# Patient Record
Sex: Male | Born: 1935 | Race: Black or African American | Hispanic: No | Marital: Married | Smoking: Former smoker
Health system: Southern US, Community
[De-identification: ages and names within clinical notes are randomized; demographics above are authoritative.]

## PROBLEM LIST (undated history)

## (undated) DIAGNOSIS — J449 Chronic obstructive pulmonary disease, unspecified: Secondary | ICD-10-CM

## (undated) DIAGNOSIS — N189 Chronic kidney disease, unspecified: Secondary | ICD-10-CM

## (undated) DIAGNOSIS — K449 Diaphragmatic hernia without obstruction or gangrene: Secondary | ICD-10-CM

## (undated) DIAGNOSIS — M1990 Unspecified osteoarthritis, unspecified site: Secondary | ICD-10-CM

## (undated) DIAGNOSIS — I5189 Other ill-defined heart diseases: Secondary | ICD-10-CM

## (undated) DIAGNOSIS — I471 Supraventricular tachycardia, unspecified: Secondary | ICD-10-CM

## (undated) DIAGNOSIS — I2510 Atherosclerotic heart disease of native coronary artery without angina pectoris: Secondary | ICD-10-CM

## (undated) DIAGNOSIS — N400 Enlarged prostate without lower urinary tract symptoms: Secondary | ICD-10-CM

## (undated) DIAGNOSIS — D649 Anemia, unspecified: Secondary | ICD-10-CM

## (undated) HISTORY — DX: Supraventricular tachycardia, unspecified: I47.10

## (undated) HISTORY — PX: OTHER SURGICAL HISTORY: SHX169

## (undated) HISTORY — DX: Chronic obstructive pulmonary disease, unspecified: J44.9

## (undated) HISTORY — DX: Other ill-defined heart diseases: I51.89

## (undated) HISTORY — DX: Supraventricular tachycardia: I47.1

## (undated) HISTORY — DX: Unspecified osteoarthritis, unspecified site: M19.90

## (undated) HISTORY — DX: Atherosclerotic heart disease of native coronary artery without angina pectoris: I25.10

## (undated) HISTORY — DX: Benign prostatic hyperplasia without lower urinary tract symptoms: N40.0

## (undated) HISTORY — DX: Diaphragmatic hernia without obstruction or gangrene: K44.9

## (undated) HISTORY — DX: Anemia, unspecified: D64.9

## (undated) HISTORY — DX: Chronic kidney disease, unspecified: N18.9

---

## 2004-10-01 ENCOUNTER — Ambulatory Visit: Payer: Self-pay | Admitting: Cardiology

## 2004-10-29 ENCOUNTER — Ambulatory Visit: Payer: Self-pay | Admitting: Cardiology

## 2004-11-09 ENCOUNTER — Ambulatory Visit: Payer: Self-pay | Admitting: Cardiovascular Disease

## 2004-11-09 ENCOUNTER — Inpatient Hospital Stay (HOSPITAL_BASED_OUTPATIENT_CLINIC_OR_DEPARTMENT_OTHER): Admission: RE | Admit: 2004-11-09 | Discharge: 2004-11-09 | Payer: Self-pay | Admitting: Cardiology

## 2004-11-16 ENCOUNTER — Ambulatory Visit: Payer: Self-pay | Admitting: Internal Medicine

## 2004-11-19 ENCOUNTER — Ambulatory Visit: Payer: Self-pay | Admitting: Cardiology

## 2008-03-14 ENCOUNTER — Encounter (INDEPENDENT_AMBULATORY_CARE_PROVIDER_SITE_OTHER): Payer: Self-pay | Admitting: Orthopaedic Surgery

## 2008-03-14 ENCOUNTER — Inpatient Hospital Stay (HOSPITAL_COMMUNITY): Admission: RE | Admit: 2008-03-14 | Discharge: 2008-03-17 | Payer: Self-pay | Admitting: Orthopaedic Surgery

## 2008-03-17 ENCOUNTER — Encounter (INDEPENDENT_AMBULATORY_CARE_PROVIDER_SITE_OTHER): Payer: Self-pay | Admitting: Orthopaedic Surgery

## 2008-03-17 ENCOUNTER — Ambulatory Visit: Payer: Self-pay | Admitting: *Deleted

## 2009-06-06 ENCOUNTER — Ambulatory Visit: Payer: Self-pay | Admitting: Cardiology

## 2009-07-10 IMAGING — CR DG CHEST 2V
3 series · 3 of 3 positions shown · non-contrast
Comparison: CT of the chest on 11/16/2004

CLINICAL DATA: Osteoarthritis of the right knee.  Preoperative
assessment.

CHEST - 2 VIEW

[view not recorded (1 of 3)]
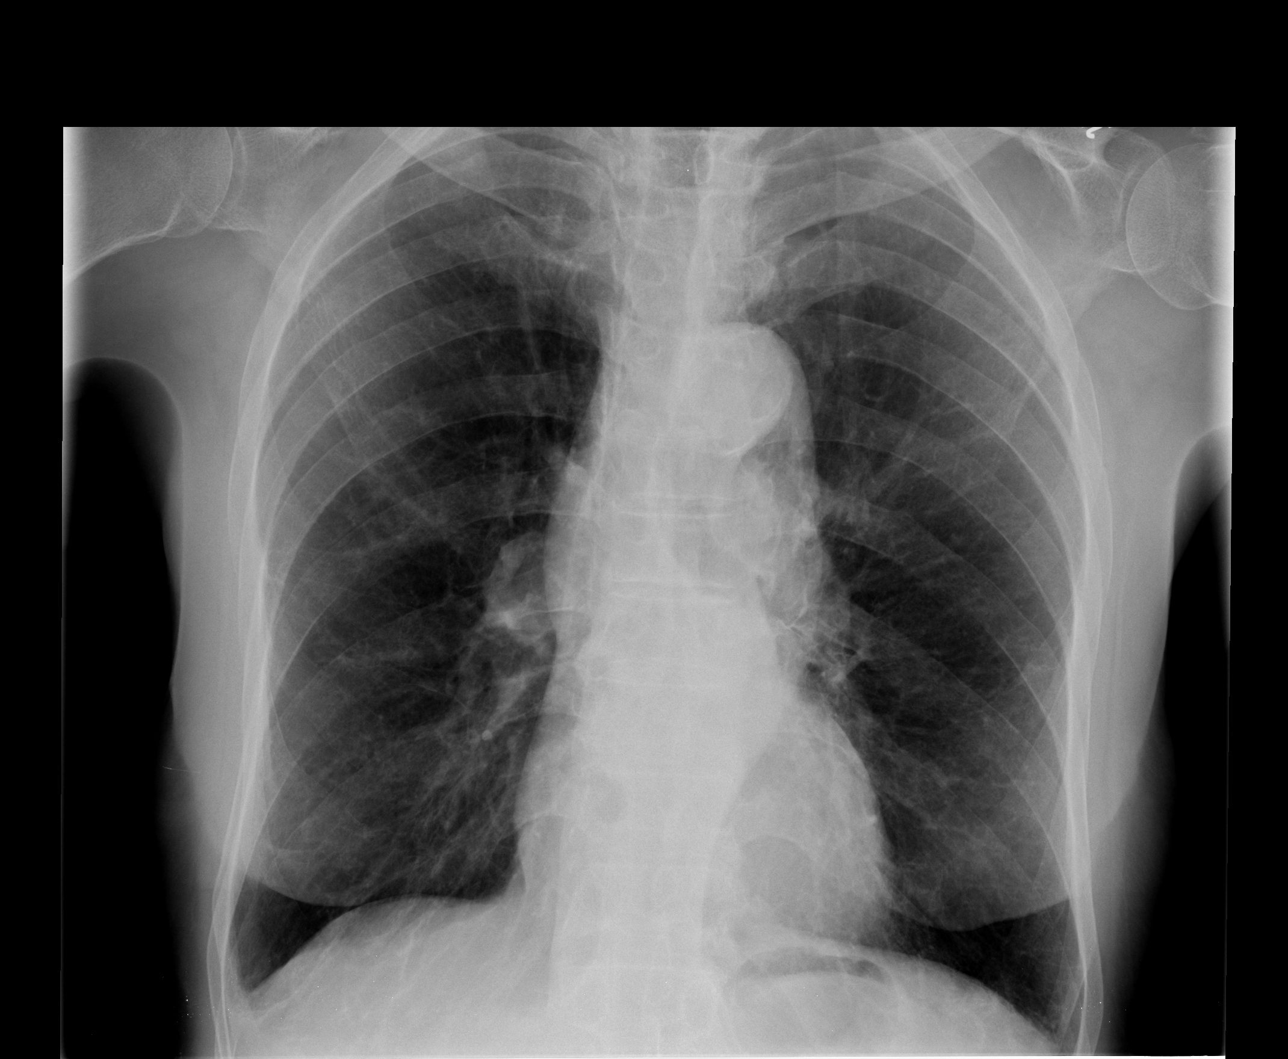

[view not recorded (2 of 3)]
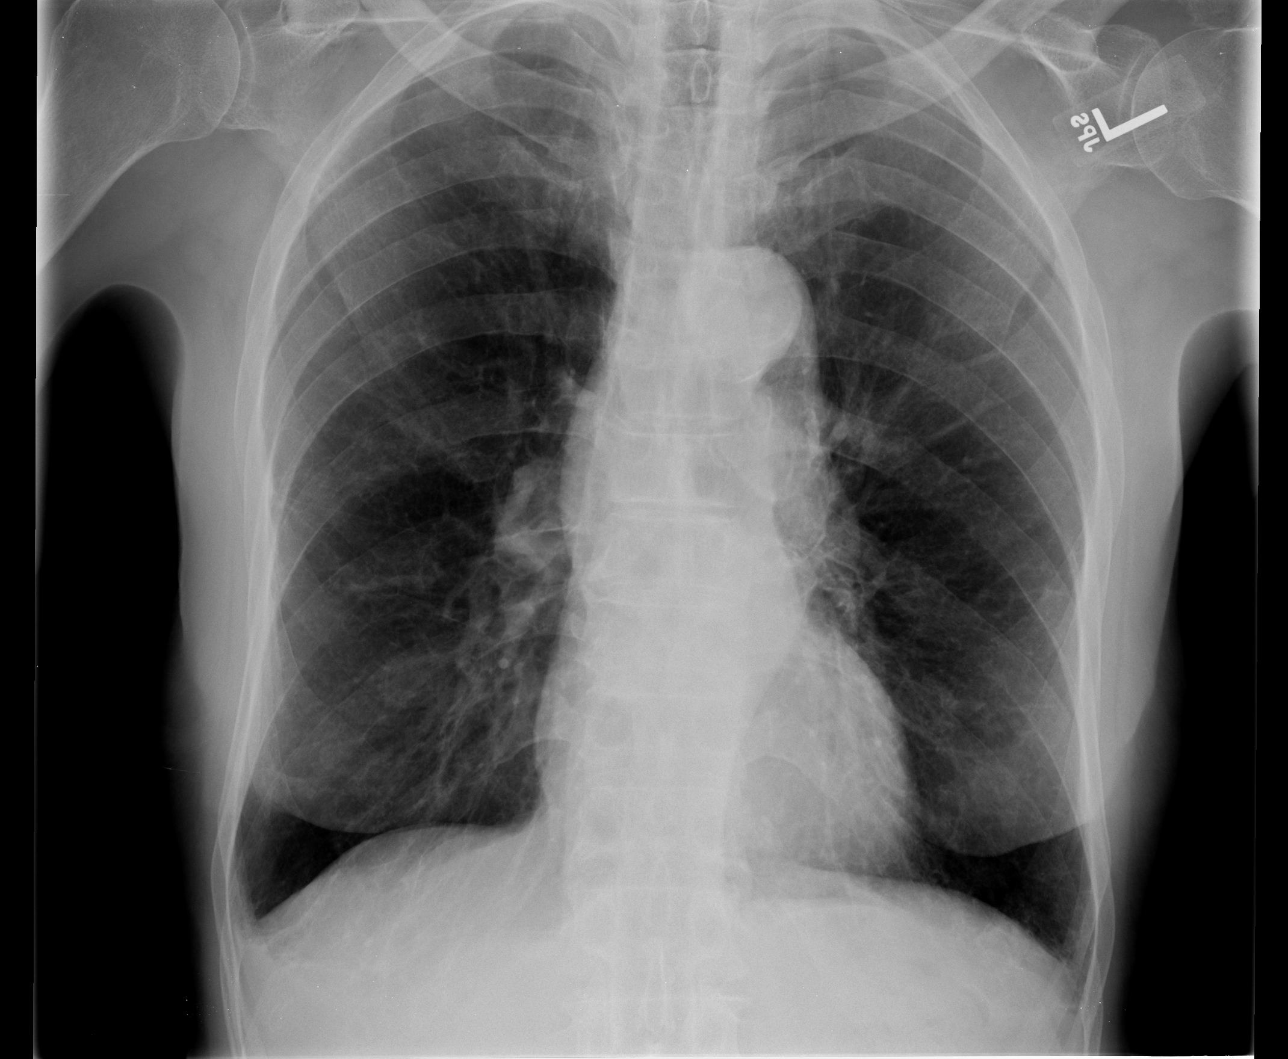

[view not recorded (3 of 3)]
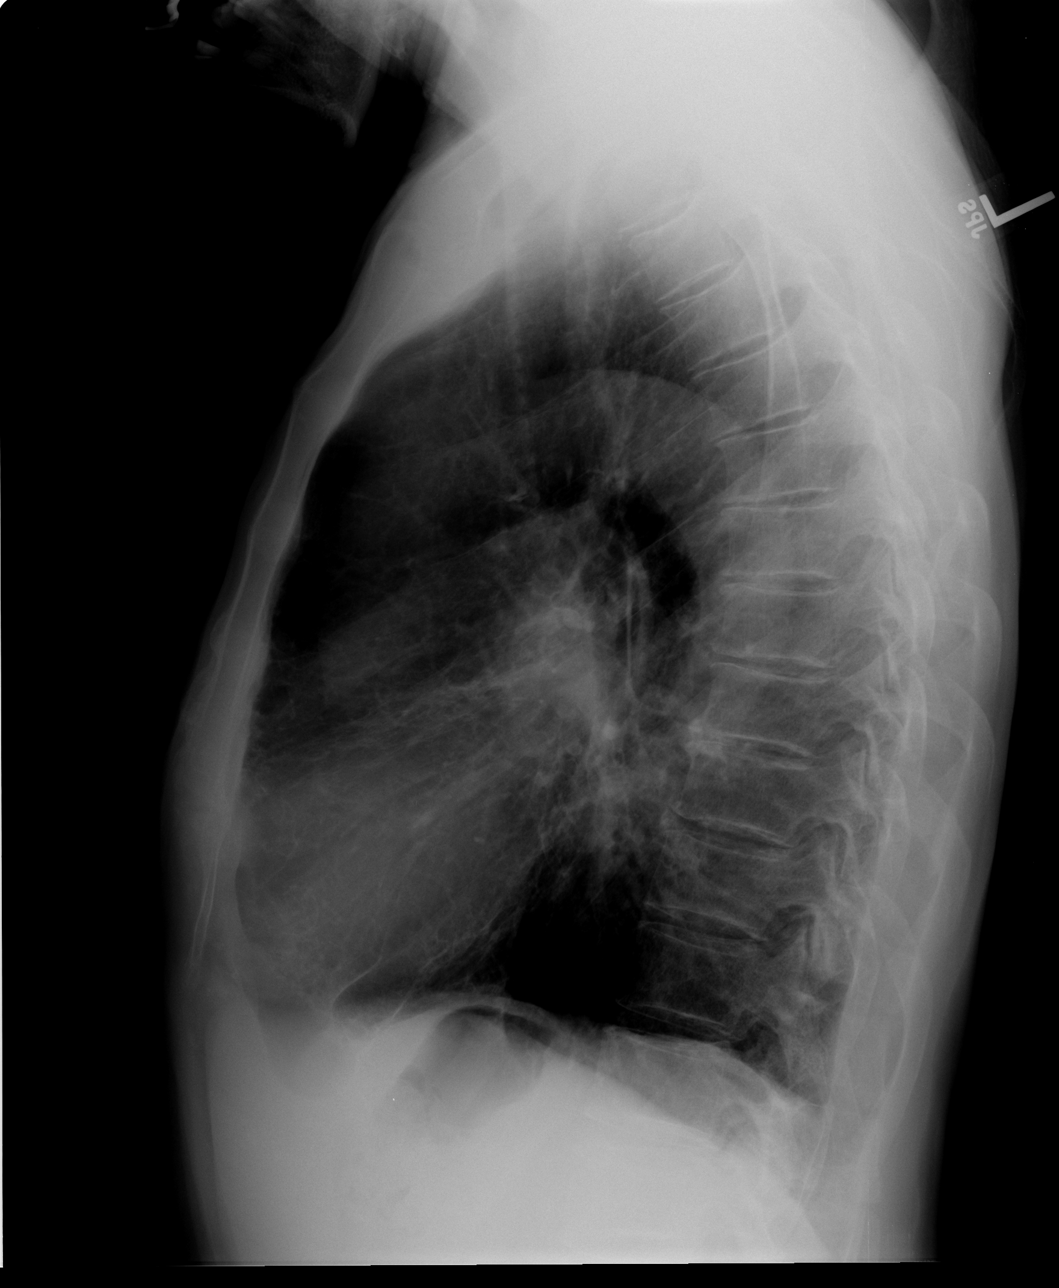

[3 of 3 positions shown; findings below may reference images not displayed]

FINDINGS: Changes of COPD are evident with hyperexpansion,
flattening of the hemidiaphragms and an increase in the
retrosternal air space.  Taking this into consideration.  Heart
size is within normal limits.  Mild ectasia of the thoracic aorta
is identified and is stable in comparison with the prior exam.
This finding can be seen with hypertension.  Prominence of
pulmonary arterial segments are seen and suggests the possibility
of some degree of pulmonary arterial hypertension.  This finding is
also stable.  The lung fields appear clear no evidence for focal
infiltrate or congestive failure.  Structures demonstrate some mild
degenerative change in the mid thoracic spine with anterior
osteophytosis noted.
IMPRESSION: Stable COPD changes with no acute disease noted.

## 2009-11-19 ENCOUNTER — Encounter: Payer: Self-pay | Admitting: Cardiology

## 2009-11-25 ENCOUNTER — Encounter (INDEPENDENT_AMBULATORY_CARE_PROVIDER_SITE_OTHER): Payer: Self-pay | Admitting: Internal Medicine

## 2009-11-25 ENCOUNTER — Inpatient Hospital Stay (HOSPITAL_COMMUNITY): Admission: RE | Admit: 2009-11-25 | Discharge: 2009-11-28 | Payer: Self-pay | Admitting: Orthopaedic Surgery

## 2010-07-03 DIAGNOSIS — R7989 Other specified abnormal findings of blood chemistry: Secondary | ICD-10-CM

## 2010-07-03 DIAGNOSIS — I471 Supraventricular tachycardia: Secondary | ICD-10-CM

## 2010-07-03 DIAGNOSIS — R0602 Shortness of breath: Secondary | ICD-10-CM

## 2010-07-06 DIAGNOSIS — R000 Tachycardia, unspecified: Secondary | ICD-10-CM

## 2010-07-11 ENCOUNTER — Encounter: Payer: Self-pay | Admitting: Cardiology

## 2010-07-16 ENCOUNTER — Encounter: Payer: Self-pay | Admitting: *Deleted

## 2010-07-18 LAB — URINALYSIS, ROUTINE W REFLEX MICROSCOPIC
Glucose, UA: NEGATIVE mg/dL
pH: 6.5 (ref 5.0–8.0)

## 2010-07-18 LAB — CBC
HCT: 25.6 % — ABNORMAL LOW (ref 39.0–52.0)
HCT: 29 % — ABNORMAL LOW (ref 39.0–52.0)
MCH: 30.3 pg (ref 26.0–34.0)
MCH: 30.7 pg (ref 26.0–34.0)
MCH: 30.8 pg (ref 26.0–34.0)
MCHC: 33.2 g/dL (ref 30.0–36.0)
MCHC: 33.7 g/dL (ref 30.0–36.0)
MCV: 92.1 fL (ref 78.0–100.0)
MCV: 92.9 fL (ref 78.0–100.0)
Platelets: 145 10*3/uL — ABNORMAL LOW (ref 150–400)
Platelets: 219 10*3/uL (ref 150–400)
RBC: 2.36 MIL/uL — ABNORMAL LOW (ref 4.22–5.81)
RBC: 3.68 MIL/uL — ABNORMAL LOW (ref 4.22–5.81)
RDW: 13.9 % (ref 11.5–15.5)
RDW: 14.1 % (ref 11.5–15.5)
RDW: 14.4 % (ref 11.5–15.5)
WBC: 12 10*3/uL — ABNORMAL HIGH (ref 4.0–10.5)
WBC: 15.7 10*3/uL — ABNORMAL HIGH (ref 4.0–10.5)
WBC: 17.6 10*3/uL — ABNORMAL HIGH (ref 4.0–10.5)

## 2010-07-18 LAB — URINE CULTURE
Colony Count: NO GROWTH
Special Requests: POSITIVE

## 2010-07-18 LAB — TYPE AND SCREEN

## 2010-07-18 LAB — BASIC METABOLIC PANEL
BUN: 17 mg/dL (ref 6–23)
BUN: 19 mg/dL (ref 6–23)
BUN: 20 mg/dL (ref 6–23)
CO2: 24 mEq/L (ref 19–32)
Calcium: 8 mg/dL — ABNORMAL LOW (ref 8.4–10.5)
Calcium: 8.4 mg/dL (ref 8.4–10.5)
Chloride: 107 mEq/L (ref 96–112)
Creatinine, Ser: 1.75 mg/dL — ABNORMAL HIGH (ref 0.4–1.5)
Creatinine, Ser: 1.89 mg/dL — ABNORMAL HIGH (ref 0.4–1.5)
GFR calc Af Amer: 41 mL/min — ABNORMAL LOW (ref >60)
GFR calc non Af Amer: 33 mL/min — ABNORMAL LOW (ref >60)
Glucose, Bld: 98 mg/dL (ref 70–99)
Potassium: 3.9 mEq/L (ref 3.5–5.1)
Sodium: 134 mEq/L — ABNORMAL LOW (ref 135–145)

## 2010-07-18 LAB — URINALYSIS, MICROSCOPIC ONLY
Bilirubin Urine: NEGATIVE
Glucose, UA: NEGATIVE mg/dL
Ketones, ur: NEGATIVE mg/dL
pH: 5 (ref 5.0–8.0)

## 2010-07-18 LAB — COMPREHENSIVE METABOLIC PANEL
AST: 20 U/L (ref 0–37)
Albumin: 3.6 g/dL (ref 3.5–5.2)
BUN: 17 mg/dL (ref 6–23)
Calcium: 8.3 mg/dL — ABNORMAL LOW (ref 8.4–10.5)
Chloride: 107 mEq/L (ref 96–112)
Creatinine, Ser: 1.61 mg/dL — ABNORMAL HIGH (ref 0.4–1.5)
Creatinine, Ser: 2.02 mg/dL — ABNORMAL HIGH (ref 0.4–1.5)
GFR calc Af Amer: 38 mL/min — ABNORMAL LOW (ref >60)
GFR calc non Af Amer: 41 mL/min — ABNORMAL LOW (ref >60)
Glucose, Bld: 103 mg/dL — ABNORMAL HIGH (ref 70–99)
Potassium: 4.7 mEq/L (ref 3.5–5.1)
Sodium: 135 mEq/L (ref 135–145)
Sodium: 138 mEq/L (ref 135–145)
Total Bilirubin: 0.5 mg/dL (ref 0.3–1.2)
Total Protein: 6.5 g/dL (ref 6.0–8.3)

## 2010-07-18 LAB — SODIUM, URINE, TIMED: Sodium, 24H Ur: 180 mEq/d (ref 40–220)

## 2010-07-18 LAB — DIFFERENTIAL
Basophils Absolute: 0.1 10*3/uL (ref 0.0–0.1)
Eosinophils Relative: 5 % (ref 0–5)
Lymphocytes Relative: 17 % (ref 12–46)
Lymphocytes Relative: 7 % — ABNORMAL LOW (ref 12–46)
Lymphs Abs: 1.1 10*3/uL (ref 0.7–4.0)
Lymphs Abs: 2 10*3/uL (ref 0.7–4.0)
Monocytes Absolute: 1 10*3/uL (ref 0.1–1.0)
Monocytes Relative: 9 % (ref 3–12)
Neutro Abs: 11.9 10*3/uL — ABNORMAL HIGH (ref 1.7–7.7)
Neutrophils Relative %: 83 % — ABNORMAL HIGH (ref 43–77)

## 2010-07-18 LAB — URINE MICROSCOPIC-ADD ON

## 2010-07-18 LAB — PREPARE RBC (CROSSMATCH)

## 2010-07-18 LAB — SURGICAL PCR SCREEN: Staphylococcus aureus: POSITIVE — AB

## 2010-07-22 ENCOUNTER — Encounter: Payer: Self-pay | Admitting: Cardiology

## 2010-07-22 ENCOUNTER — Ambulatory Visit (INDEPENDENT_AMBULATORY_CARE_PROVIDER_SITE_OTHER): Payer: Medicare Other | Admitting: Cardiology

## 2010-07-22 VITALS — BP 108/69 | HR 80 | Ht 68.0 in | Wt 155.0 lb

## 2010-07-22 DIAGNOSIS — Z79899 Other long term (current) drug therapy: Secondary | ICD-10-CM

## 2010-07-22 DIAGNOSIS — I2510 Atherosclerotic heart disease of native coronary artery without angina pectoris: Secondary | ICD-10-CM | POA: Insufficient documentation

## 2010-07-22 DIAGNOSIS — J449 Chronic obstructive pulmonary disease, unspecified: Secondary | ICD-10-CM

## 2010-07-22 DIAGNOSIS — I498 Other specified cardiac arrhythmias: Secondary | ICD-10-CM

## 2010-07-22 DIAGNOSIS — I4892 Unspecified atrial flutter: Secondary | ICD-10-CM

## 2010-07-22 DIAGNOSIS — I471 Supraventricular tachycardia, unspecified: Secondary | ICD-10-CM | POA: Insufficient documentation

## 2010-07-22 NOTE — Assessment & Plan Note (Signed)
Will check followup metabolic profile, as well as BNP level.

## 2010-07-22 NOTE — Assessment & Plan Note (Signed)
Continue current medical therapy with combination beta blocker and calcium channel blocker. No further workup currently indicated.

## 2010-07-22 NOTE — Progress Notes (Signed)
HPI: Patient presents to our office for the first time, following recent referral to [place] here at [facility], for evaluation of dyspnea and newly documented SVT. He presented with no prior history of heart disease.  We felt his rhythm to be probable atrial flutter, which successfully reverted back to normal sinus rhythm, on medical therapy. A 2-D echo was obtained, indicating normal LVF (EF 60-65%), with grade 3 diastolic dysfunction, and no significant valvular abnormalities. Serial cardiac markers were drawn, notable for nondiagnostic troponins. Patient had had prior catheterization in 2006 revealing nonobstructive CAD. More recently, he had a Lexis scan Cardiolite, 2/11, which was negative for ischemia; EF 48%.  We adjusted his medications with discontinuation of digoxin, in light of chronic kidney disease. He was discharged on a combination of beta blocker and calcium channel blocker, for rate and rhythm control.  Since his hospitalization, the patient has done well. He denies any recurrent dyspnea. He continues to report no tachycardia palpitations. He also denies any exertional chest pain.  No Known Allergies  Current Outpatient Prescriptions on File Prior to Visit  Medication Sig Dispense Refill  . albuterol (PROVENTIL) (2.5 MG/3ML) 0.083% nebulizer solution Take 2.5 mg by nebulization every 6 (six) hours as needed.        Marland Kitchen allopurinol (ZYLOPRIM) 300 MG tablet Take 300 mg by mouth daily.        Marland Kitchen aspirin 81 MG tablet Take 81 mg by mouth daily.        Marland Kitchen docusate sodium (COLACE) 100 MG capsule Take 2 by mouth twice daily        . Fluticasone-Salmeterol (ADVAIR DISKUS) 250-50 MCG/DOSE AEPB Inhale 1 puff into the lungs every 12 (twelve) hours.        . isosorbide mononitrate (IMDUR) 60 MG 24 hr tablet Take 1/2 by mouth daily       . megestrol (MEGACE) 40 MG/ML suspension Take 5ml by mouth twice times daily      . metoprolol tartrate (LOPRESSOR) 25 MG tablet Take one by mouth twice daily        . polyethylene glycol (MIRALAX / GLYCOLAX) packet 17 gm mixed with 8 oz water or juice twice daily       . DISCONTD: diltiazem (CARDIZEM CD) 360 MG 24 hr capsule Take 360 mg by mouth daily.          Past Medical History  Diagnosis Date  . COPD (chronic obstructive pulmonary disease)   . Coronary artery disease   . Supraventricular tachycardia   . Chronic kidney disease     stage 3  . Gout   . Hiatal hernia   . BPH (benign prostatic hypertrophy)   . DJD (degenerative joint disease)     severe of both knees    Past Surgical History  Procedure Date  . Bilateral knee replacements   . Bilateral cataracts     History   Social History  . Marital Status: Married    Spouse Name: N/A    Number of Children: N/A  . Years of Education: N/A   Occupational History  . Not on file.   Social History Main Topics  . Smoking status: Former Smoker -- 2.0 packs/day for 25 years    Quit date: 05/03/1960  . Smokeless tobacco: Not on file  . Alcohol Use: No  . Drug Use: Not on file  . Sexually Active: Not on file   Other Topics Concern  . Not on file   Social History Narrative  .  No narrative on file    Family History  Problem Relation Age of Onset  . Stomach cancer Father     ROS: The patient denies fatigue, malaise, fever, weight gain/loss, vision loss, decreased hearing, hoarseness, chest pain, palpitations, shortness of breath, prolonged cough, wheezing, sleep apnea, coughing up blood, abdominal pain, blood in stool, nausea, vomiting, diarrhea, heartburn, incontinence, blood in urine, muscle weakness, joint pain, leg swelling, rash, skin lesions, headache, fainting, dizziness, depression, anxiety, enlarged lymph nodes, easy bruising or bleeding, and environmental allergies.      PHYSICAL EXAM:  BP 108/69  Pulse 80  Ht 5\' 8"  (1.727 m)  Wt 155 lb (70.308 kg)  BMI 23.57 kg/m2  General: Well-developed, well-nourished in no distress head: Normocephalic and  atraumatic eyes PERRLA/EOMI intact, conjunctiva and lids normal nose: No deformity or lesions mouth normal dentition, normal posterior pharynx neck: Supple, JVP noted on the right, at 90 degrees.  No masses, thyromegaly or abnormal cervical nodes lungs: Normal breath sounds bilaterally without wheezing.  Normal percussion heart: regular rate and rhythm with normal S1 and S2, no S3 or S4.  PMI is normal.  No pathological murmurs abdomen: Normal bowel sounds, abdomen is soft and nontender without masses, organomegaly or hernias noted.  No hepatosplenomegaly musculoskeletal: Back normal, normal gait muscle strength and tone normal pulsus: Pulse is normal in all 4 extremities Extremities: 2+ right ankle edema, trace on the left neurologic: Alert and oriented x 3 skin: Intact without lesions or rashes cervical nodes: No significant adenopathy psychologic: Normal affect  ASSESSMENT & PLAN:

## 2010-07-22 NOTE — Patient Instructions (Signed)
Your physician recommends that you go to the [facility] for lab work: BMET/BNP. Your physician recommends that you continue on your current medications as directed. Please refer to the Current Medication list given to you today.

## 2010-07-22 NOTE — Assessment & Plan Note (Signed)
Quiescent on current medical therapy. No further workup indicated.

## 2010-07-28 ENCOUNTER — Encounter: Payer: Self-pay | Admitting: Physician Assistant

## 2010-07-30 NOTE — Letter (Signed)
Summary: Discharge Summary  Discharge Summary   Imported By: Zachary George 07/22/2010 14:31:16  _____________________________________________________________________  External Attachment:    Type:   Image     Comment:   External Document

## 2010-09-15 NOTE — Op Note (Signed)
NAME:  ESTER, MABE NO.:  [id number]   MEDICAL RECORD NO.:  [id number]          PATIENT TYPE:  INP   LOCATION:  5033                         FACILITY:  [facility]   PHYSICIAN:  Claude Manges. Whitfield, M.D.DATE OF BIRTH:  11-18-1924   DATE OF PROCEDURE:  03/14/2008  DATE OF DISCHARGE:                               OPERATIVE REPORT   PREOPERATIVE DIAGNOSIS:  End-stage osteoarthritis, right knee.   POSTOPERATIVE DIAGNOSIS:  End-stage osteoarthritis, right knee.   PROCEDURE:  Right total knee replacement.   SURGEON:  Claude Manges. Cleophas Dunker, MD   ASSISTANT:  Oris Drone. Petrarca, PA-C   ANESTHESIA:  General with supplemental femoral nerve block.   COMPLICATIONS:  None.   COMPONENTS:  DePuy LCS large femoral component, a #5 rotating keeled  tibial tray, a 10-mm polyethylene bridging bearing, a tried pegged metal  back rotating patella corresponding the size of the femur.  All  components were secured with polymethyl methacrylate.   PROCEDURE:  With comfortable, the patient was met in the holding area,  all questions were answered.  The right lower extremity was marked as  the appropriate extremity.  The patient was taken to room #4 where we  was placed under general orotracheal anesthesia.  He did have a  preoperative femoral nerve block.  Nursing staff inserted a Foley  catheter.  Urine was clear.   The right lower extremity was then placed in a right thigh tourniquet.  The leg was then prepped with Betadine, scrub and DuraPrep from the  tourniquet to the midfoot.  Sterile draping was performed with the  extremity still elevated, was Esmarch exsanguinated with a proximal  tourniquet at 350 mmHg.   A time-out was called.   A midline longitudinal incision was then made beginning at the superior  pouch extending the tibial tubercle via sharp dissection.  Incision was  carried down to the subcutaneous tissue.  First layer of capsule was  incised in the midline.  A medial  parapatellar incision was made with  the Bovie.  The joint was then entered.  There was a minimal clear  yellow joint effusion.   The patella was everted to 180 degrees and knee flexed to 90 degrees.   The joint was then explored.  There was diffuse synovitis, some of which  was rust-colored.  We sent specimens for final sectioning, a synovectomy  was performed.  There are large osteophytes along the medial and lateral  femoral condyle and the medial tibial plateau.  These were removed.  There was complete absence of articular cartilage in the lateral  compartment with the eburnated bone and nearly the same on the lateral  compartment.  There was erosion of the bone in the posterior lateral  tibial plateau, but had good firm bone.   We templated a large femoral component and this was confirmed  intraoperatively.   The initial cut was then made in the tibia with a 7-degree of posterior  declination.  Subsequent cuts were then made on the femur with a 3-  degree distal femoral valgus cut.  MCL and LCL remained intact  throughout  the operative procedure.  A lamina spreaders were then  inserted along the medial and lateral compartments and remnants of ACL  and PCL, removed as well as the medial and lateral menisci.  Osteophytes  on the posterior femoral condyles were removed with a curved osteotome.   The final femoral cut was then made to provide tapered cuts.   Retractor was then placed about the tibia.  We measured a #5 tibial  tray.  A template was applied.  The center hole made followed by the  keeled cut.  There was an area where he have depression at the tibial  plateau from where posterior laterally and we drilled this with multiple  pinholes.   With the trial tibial tray in place, the 10-mm bridging bearing was  inserted followed by the large femoral component through a full range of  motion.  We had full extension and we had flexion well over 120 degrees  without any  malrotation of the tibial tray.  There was no opening with  varus or valgus stress.   Patella was then prepared by removing 12-mm of bone leaving of patella  thickness of 14.  Template was applied.  Three holes drilled in the  patella and the trial patella was applied through a full range of  motion.  There was no subluxation.   The trial components were then removed.  The joint was copiously  irrigated with the jet saline.  With the retractors in placed, we  impacted the tibial tray with polymethyl methacrylate and finger stuff  the tibial hole.  We applied glue in the poster lateral tibial plateau  to buttress the gap.  The final 10-mm bridging bearing was then applied.  Extraneous methacrylate was removed from around the tibia.   The large femoral component was impacted with polymethyl methacrylate,  but the components were then placed in extension and impacted, then a  further extraneous methacrylate was removed with a Glorious Peach.   The patella was applied with methacrylate and a patellar clamp.   After complete maturation of the methacrylate, the joint was then  inspected.  There were few areas of extraneous methacrylate, was removed  with an osteotome.  Joint was then copiously irrigated with saline  again.  Marcaine with epinephrine was injected in the deep capsule.  Tourniquet was deflated.  We had immediate capillary refill to the  operative site.  Gross bleeders were Bovie coagulated.  The Hemovac was  not necessary.   The deep capsule was closed with interrupted #1 Ethibond.  Superficial  capsule closed with a running 0 Vicryl, subcu with 2-0 Vicryl, skin  closed with skin clips.  Sterile bulky dressing was applied followed by  the patient's compressive stocking.   The patient tolerated the procedure well without complications and  returned to the postanesthesia recovery room in satisfactory condition.      Claude Manges. Cleophas Dunker, M.D.  Electronically Signed      PWW/MEDQ  D:  03/14/2008  T:  03/15/2008  Job:  [id number]

## 2010-09-18 NOTE — Discharge Summary (Signed)
NAME:  Ian Robles NO.:  [id number]   MEDICAL RECORD NO.:  [id number]          PATIENT TYPE:  INP   LOCATION:  5033                         FACILITY:  [facility]   PHYSICIAN:  Claude Manges. Whitfield, M.D.DATE OF BIRTH:  09-15-1924   DATE OF ADMISSION:  03/14/2008  DATE OF DISCHARGE:  03/17/2008                               DISCHARGE SUMMARY   ADMISSION DIAGNOSIS:  Osteoarthritis of the right knee.   DISCHARGE DIAGNOSES:  1. Osteoarthritis and rheumatoid arthritis of the right knee.  2. History of renal calculi.  3. History of tinnitus.  4. Posthemorrhagic anemia.  5. Hypertension.  6. History of tobacco use.   PROCEDURE:  Right total knee arthroplasty.   HISTORY:  Ian Robles is a very pleasant 77 year old male with  chronic right knee pain, which is worsened over the last several months.  There is no history of injury or trauma.  He has been diagnosed with  rheumatoid arthritis in the past.  Now having severe sharp and throbbing  pain, which has worsen.  Radiographic end-stage osteoarthritis right  knee.  Indicated for right total knee arthroplasty.   HOSPITAL COURSE:  An 77 year old male admitted on March 14, 2008, and  after appropriate laboratory studies were obtained as well as 1 g of  Ancef IV, on-call via operating room.  He was taken to the operating  room, where he underwent a right total knee arthroplasty.  He tolerated  the procedure well.  He was continued on Ancef 1 g IV q. 6-8 h. x3  doses.  He was started on Coumadin protocol per Pharmacy.  Total knee  protocol for Lovenox 30 mg subcu q.12 h. started on April 01, 2008,  at 8 a.m.  Foley was placed intraoperatively.  CPM 0-6 degrees for 6-8  hours per day, increasing by 5-10 degrees a day.  Consulted PT, OT, and  care management.  Partial weightbearing 50% of body weight.  Dilaudid  reduced dose PCA was given for pain medications for postop pain.  The  PCA was discontinued on the 13th.   The IV was then saline locked after  DC.  He was weaned off his O2, keeping the sats greater than 90%.  Cepacaine lozenges were used for his sore throat that he developed in  the hospital.  He was transfused on the 14th.  He was transfused 2 units  of packed cells with 20 of Lasix between units.  Urine and her routine  C&S was ordered on November 14.  Dressing change to the right knee was  also ordered.  Apparently, he developed shortness of breath, and his  blood transfusion restarted by Dr. Eulah Pont, and a portable chest x-ray  was ordered.  20 units of Lasix was given stat at 2130 hours on the  14th.  The second unit of blood was not given.  There was an order for  additional 20 mg of Lasix IV after the first unit of blood if short of  breath.  On the 15th, he had venous Dopplers ordered to rule out DVT.  Cipro 250 b.i.d. was started.  He  was discharged on the 15th to home.  Dopplers revealed no evidence of DVT.  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  Septal infarct age indeterminate noted.  Preop  hemoglobin 11.8, hematocrit 35.9%, white count 15,200, and platelets  382,000.  He dropped to 7.9 hemoglobin on the November 14.  He received  20 units of blood and his hemoglobin at the time of discharge was 8.9,  hematocrit 26.5%, white count of 15,000, platelets 218,000.  Protime  preop 14.3, INR 1.1, PTT was 32.  At discharge, PT was 20.0, INR 1.6.  Preop sodium 139, potassium 4.2, chloride 105, CO2 of 28, glucose 92,  BUN 15, creatinine 0.94.  Discharge sodium 137, potassium 3.44, chloride  101, CO2 of 28, glucose 97, BUN 13, creatinine 0.87.  GFR preop was  greater than 60 and discharge is greater than 60.  Preop total protein  7.6, albumin 3.0, AST 23, ALT 12, ALP 41, total bilirubin 0.6.  Urinalysis of November 10, was benign.  November 14, revealed 3-6  whites, 0-2 reds, few bacteria, hyaline  casts, and mucus was present.  Urine culture of November 10 revealed no growth.  Culture of November  14, revealed 2000 colonies of insignificant growth.  Blood type was B+.  Antibody screen was positive, and it was the antibody was Anti-Lewis A.  He was negative though for Lewis A antigen.   DISCHARGE INSTRUCTIONS:  No restrictions in his diet.  Keep his incision  clean and dry.  Change the dressing daily with sterile 4x4s and tape.  Increase activity slowly.  Use his walker 50% weightbearing as tolerated  by PT.  No lifting or driving for 6 weeks.  He may shower on Tuesday.   MEDICATIONS:  1. Percocet 5/325 one-two tabs every four hours as needed for pain.  2. Robaxin 500 mg 1 tablet every eight as needed for spasms.  3. Coumadin 5 mg as directed by home health.  4. Cipro 250 mg b.i.d. for 5 days.  5. Flomax 0.4 mg daily.  6. Colace 100 mg a day.  7. Stool softener, MiraLax 17 g in 8 hours in water as needed for      constipation.  Continue his previous meds __________ Avodart 0.5 mg daily.  Call his  doctor for pain not relieved with medication or temperature greater than  101 or other problems.  Apparently, he was allowed to also take  ketoprofen 75 mg twice a day and multivitamin daily.  He was discharged  in improved condition.      Oris Drone Petrarca, P.A.-C.      Claude Manges. Cleophas Dunker, M.D.  Electronically Signed    BDP/MEDQ  D:  05/14/2008  T:  05/15/2008  Job:  [id number]

## 2010-09-18 NOTE — Cardiovascular Report (Signed)
NAME:  Ian Robles,  NO.:  [id number]   MEDICAL RECORD NO.:  [id number]          PATIENT TYPE:  OIB   LOCATION:  6501                         FACILITY:  [facility]   PHYSICIAN:  Charlton Haws, M.D.     DATE OF BIRTH:  16-Mar-1925   DATE OF PROCEDURE:  11/09/2004  DATE OF DISCHARGE:                              CARDIAC CATHETERIZATION   PROCEDURE:  Coronary arteriography   INDICATION:  Multiple coronary risk factors, chest pain.   __________ catheterization is done with 4 French catheter in right femoral  artery. The patient had a dilated aortic root. However, using the J wire we  were able to seat the DJL 4 catheter in the left main coronary artery.   The patient will need a follow-up CT scan to size his aortic root.   Left main coronary artery was normal.   Left anterior descending artery had 20-30% multiple discrete lesions in the  mid distal vessel.   First and second diagonal branches were normal.   The circumflex coronary artery was nondominant. It was normal.   The right coronary artery was dominant. There was a 30-40% lesion just after  the takeoff of the PDA. The remainder of the vessel was normal.   ARTERY VENTRICULOGRAPHY:  Artery ventriculography was normal. Ejection  fraction 60%. There was no gradient across the aortic valve and no MR.  Aortic pressure was 170/81, LV pressure was 170/14.   There was moderate aortic root dilatation by left ventriculogram.   IMPRESSION:  The patient does not have significant coronary artery disease.  His chest x-ray showed question of a right hilar mass. He needs follow-up  chest CT scan which will be arranged to size his aortic root and also to  assess the right hilar mass.   The patient will then follow up with Dr. Linna Darner and Dr. Andee Lineman in [place]. I  suspect he has significant hypertension, given his readings here and the  dilated aortic root.   However, his admission H&P showed a blood pressure of 138/80 and he  has no  history of being on blood pressure medicine.   I will leave this up to Dr. Linna Darner and Dr. Andee Lineman but I suspect that starting  low-dose ACE inhibitor or Norvasc after he has had his follow-up CT scan  would be in order.       PN/MEDQ  D:  11/09/2004  T:  11/09/2004  Job:  [id number]   cc:   Dr. Linna Darner in [place]   c/o Dr. Lorin Mercy group [facility]

## 2010-10-01 ENCOUNTER — Ambulatory Visit: Payer: Medicare Other | Admitting: Cardiology

## 2010-10-22 ENCOUNTER — Other Ambulatory Visit: Payer: Self-pay | Admitting: Adult Health

## 2010-10-22 ENCOUNTER — Ambulatory Visit (INDEPENDENT_AMBULATORY_CARE_PROVIDER_SITE_OTHER): Payer: Medicare Other | Admitting: Adult Health

## 2010-10-22 ENCOUNTER — Other Ambulatory Visit: Payer: Self-pay

## 2010-10-22 ENCOUNTER — Ambulatory Visit: Payer: Medicare Other | Admitting: Cardiology

## 2010-10-22 ENCOUNTER — Encounter: Payer: Self-pay | Admitting: Adult Health

## 2010-10-22 DIAGNOSIS — I498 Other specified cardiac arrhythmias: Secondary | ICD-10-CM

## 2010-10-22 DIAGNOSIS — J449 Chronic obstructive pulmonary disease, unspecified: Secondary | ICD-10-CM

## 2010-10-22 DIAGNOSIS — J45909 Unspecified asthma, uncomplicated: Secondary | ICD-10-CM

## 2010-10-22 DIAGNOSIS — I2510 Atherosclerotic heart disease of native coronary artery without angina pectoris: Secondary | ICD-10-CM

## 2010-10-22 DIAGNOSIS — R000 Tachycardia, unspecified: Secondary | ICD-10-CM

## 2010-10-22 DIAGNOSIS — I471 Supraventricular tachycardia: Secondary | ICD-10-CM

## 2010-10-22 DIAGNOSIS — R062 Wheezing: Secondary | ICD-10-CM

## 2010-10-22 MED ORDER — ALBUTEROL SULFATE HFA 108 (90 BASE) MCG/ACT IN AERS: 2.0000 | INHALATION_SPRAY | Freq: Four times a day (QID) | RESPIRATORY_TRACT | Status: DC | PRN

## 2010-10-22 MED ORDER — ALBUTEROL SULFATE (2.5 MG/3ML) 0.083% IN NEBU: 2.5000 mg | INHALATION_SOLUTION | Freq: Four times a day (QID) | RESPIRATORY_TRACT | Status: AC | PRN

## 2010-10-22 NOTE — Assessment & Plan Note (Signed)
He is stable from our standpoint at this time.  He will continue his current medications.  Will see him in 6 months either in the [place] office or [place] office.

## 2010-10-22 NOTE — Progress Notes (Signed)
HPI: [name] is a 77 y/o patient of [name], Ian Robles, and Dr. Andee Robles normally seen in the [facility] office, who is here for 3 month follow-up with known history of SVT, CAD with most recent catheterization in 2006 which showed nonobstructive CAD, with Lexiscan stress test demonstrating no ischemia with EF of 48%.  He also had a history of CKD and digoxin was discontinued after recent hospitalization in March of 2012.  He uses a walker for ambulation, and is often short of breath with exertion.  He denies chest pain, dizziness or palpitations.  He remains active.  Good appetite.  Dr. Waldemar Robles follows labs to include cholesterol.  No Known Allergies  Current Outpatient Prescriptions  Medication Sig Dispense Refill  . allopurinol (ZYLOPRIM) 300 MG tablet Take 300 mg by mouth daily.        Marland Kitchen aspirin 81 MG tablet Take 81 mg by mouth daily.        Marland Kitchen docusate sodium (COLACE) 100 MG capsule Take 2 by mouth twice daily        . isosorbide mononitrate (IMDUR) 60 MG 24 hr tablet Take 1/2 by mouth daily       . megestrol (MEGACE) 40 MG/ML suspension Take 5ml by mouth twice times daily      . metoprolol tartrate (LOPRESSOR) 25 MG tablet Take one by mouth twice daily       . DISCONTD: albuterol (PROVENTIL) (2.5 MG/3ML) 0.083% nebulizer solution Take 2.5 mg by nebulization every 6 (six) hours as needed.        Marland Kitchen albuterol (VENTOLIN HFA) 108 (90 BASE) MCG/ACT inhaler Inhale 2 puffs into the lungs every 6 (six) hours as needed for wheezing.  1 Inhaler  3  . diltiazem (CARDIZEM CD) 180 MG 24 hr capsule Take two by mouth daily        . polyethylene glycol (MIRALAX / GLYCOLAX) packet 17 gm mixed with 8 oz water or juice twice daily       . DISCONTD: Fluticasone-Salmeterol (ADVAIR DISKUS) 250-50 MCG/DOSE AEPB Inhale 1 puff into the lungs every 12 (twelve) hours.          Past Medical History  Diagnosis Date  . COPD (chronic obstructive pulmonary disease)   . Coronary artery disease   . Supraventricular tachycardia     . Chronic kidney disease     stage 3  . Gout   . Hiatal hernia   . BPH (benign prostatic hypertrophy)   . DJD (degenerative joint disease)     severe of both knees    Past Surgical History  Procedure Date  . Bilateral knee replacements   . Bilateral cataracts     ROS: Review of systems complete and found to be negative unless listed above PHYSICAL EXAM BP 121/70  Pulse 85  Ht 5\' 11"  (1.803 m)  Wt 130 lb (58.968 kg)  BMI 18.13 kg/m2  SpO2 94% General: Well developed, well nourished, in no acute distress Head: Eyes PERRLA, No xanthomas.   Normal cephalic and atramatic  Lungs: Clear bilaterally to auscultation and percussion, diminished in the bases. Heart: HRRR S1 S2, .  Pulses are 2+ & equal.            No carotid bruit. No JVD.  No abdominal bruits. No femoral bruits. Abdomen: Bowel sounds are positive, abdomen soft and non-tender without masses or                  Hernia's noted. Msk:  Back normal, normal  gait.Diminshed overalll strength and tone for age. Extremities: No clubbing, cyanosis or edema.  DP +1 Neuro: Alert and oriented X 3. Psych:  Good affect, responds appropriately   ASSESSMENT AND PLAN

## 2010-10-22 NOTE — Assessment & Plan Note (Signed)
He denies any further palpitations or fast HR. He will continue on metoprolol as he has had no bronchospasms,

## 2010-10-22 NOTE — Patient Instructions (Signed)
Your physician recommends that you schedule a follow-up appointment in: 6 months Your physician recommends that you return for lab work in: today Your physician has recommended you make the following change in your medication: ventolin inhaler two puffs every 6hrs as needed for wheezing

## 2010-10-22 NOTE — Assessment & Plan Note (Signed)
I have provided him a Rx for MDI inhaler to have with him as a rescue dose.  It is to continue his current home Neb tx as usual.

## 2010-10-23 LAB — CBC WITH DIFFERENTIAL/PLATELET
Lymphocytes Relative: 21 % (ref 12–46)
Lymphs Abs: 2.8 10*3/uL (ref 0.7–4.0)
Neutrophils Relative %: 62 % (ref 43–77)
Platelets: 250 10*3/uL (ref 150–400)
RBC: 3.36 MIL/uL — ABNORMAL LOW (ref 4.22–5.81)
WBC: 13.4 10*3/uL — ABNORMAL HIGH (ref 4.0–10.5)

## 2010-10-23 LAB — BASIC METABOLIC PANEL
CO2: 19 mEq/L (ref 19–32)
Calcium: 8.9 mg/dL (ref 8.4–10.5)
Chloride: 111 mEq/L (ref 96–112)
Sodium: 143 mEq/L (ref 135–145)

## 2011-01-25 ENCOUNTER — Other Ambulatory Visit: Payer: Self-pay | Admitting: Adult Health

## 2011-01-26 ENCOUNTER — Other Ambulatory Visit: Payer: Self-pay | Admitting: Adult Health

## 2011-02-02 LAB — BASIC METABOLIC PANEL
BUN: 12
CO2: 27
CO2: 28
Calcium: 8.5
Calcium: 8.9
Chloride: 103
Chloride: 99
Creatinine, Ser: 0.85
Creatinine, Ser: 0.87
GFR calc Af Amer: >60
GFR calc Af Amer: >60
Glucose, Bld: 110 — ABNORMAL HIGH
Glucose, Bld: 98
Potassium: 3.6
Sodium: 137
Sodium: 137

## 2011-02-02 LAB — CBC
HCT: 23.8 — ABNORMAL LOW
HCT: 34.1 — ABNORMAL LOW
HCT: 35.9 — ABNORMAL LOW
Hemoglobin: 10 — ABNORMAL LOW
Hemoglobin: 11.8 — ABNORMAL LOW
Hemoglobin: 8.9 — ABNORMAL LOW
MCHC: 32.5
MCHC: 32.8
MCHC: 33
MCHC: 34.1
MCV: 89.8
MCV: 90.6
Platelets: 246
Platelets: 355
RBC: 2.95 — ABNORMAL LOW
RBC: 3.26 — ABNORMAL LOW
RDW: 13.2
RDW: 13.2
RDW: 13.2
RDW: 13.4
WBC: 15 — ABNORMAL HIGH
WBC: 16.8 — ABNORMAL HIGH

## 2011-02-02 LAB — TYPE AND SCREEN
ABO/RH(D): B POS
Antibody Screen: POSITIVE
DAT, IgG: NEGATIVE

## 2011-02-02 LAB — COMPREHENSIVE METABOLIC PANEL
Alkaline Phosphatase: 41
BUN: 15
GFR calc non Af Amer: >60
Glucose, Bld: 92
Potassium: 4.2
Total Protein: 7.6

## 2011-02-02 LAB — POCT I-STAT 4, (NA,K, GLUC, HGB,HCT)
Glucose, Bld: 108 — ABNORMAL HIGH
Potassium: 3.7

## 2011-02-02 LAB — DIFFERENTIAL
Basophils Absolute: 0
Basophils Relative: 0
Monocytes Relative: 5
Neutro Abs: 12.5 — ABNORMAL HIGH
Neutrophils Relative %: 82 — ABNORMAL HIGH

## 2011-02-02 LAB — URINALYSIS, ROUTINE W REFLEX MICROSCOPIC
Bilirubin Urine: NEGATIVE
Glucose, UA: NEGATIVE
Hgb urine dipstick: NEGATIVE
Ketones, ur: NEGATIVE
Nitrite: NEGATIVE
Protein, ur: NEGATIVE
Protein, ur: NEGATIVE
Specific Gravity, Urine: 1.018
Urobilinogen, UA: 1
Urobilinogen, UA: 1

## 2011-02-02 LAB — CROSSMATCH
Antibody Screen: POSITIVE
DAT, IgG: NEGATIVE

## 2011-02-02 LAB — PROTIME-INR
INR: 1.1
INR: 1.2
INR: 1.6 — ABNORMAL HIGH
Prothrombin Time: 14.3
Prothrombin Time: 20 — ABNORMAL HIGH
Prothrombin Time: 21.1 — ABNORMAL HIGH

## 2011-02-02 LAB — URINE CULTURE
Colony Count: 2000
Special Requests: NEGATIVE

## 2011-02-02 LAB — URINE MICROSCOPIC-ADD ON

## 2011-02-02 LAB — APTT: aPTT: 32

## 2011-03-19 IMAGING — CR DG CHEST 2V
2 series · 2 of 2 positions shown · non-contrast
Comparison: 03/16/2008

CLINICAL DATA: Preoperative assessment for knee surgery,
osteoarthritis left knee, shortness of breath with exertion, former
smoker, history of myocardial infarction

CHEST - 2 VIEW

[view not recorded (1 of 2)]
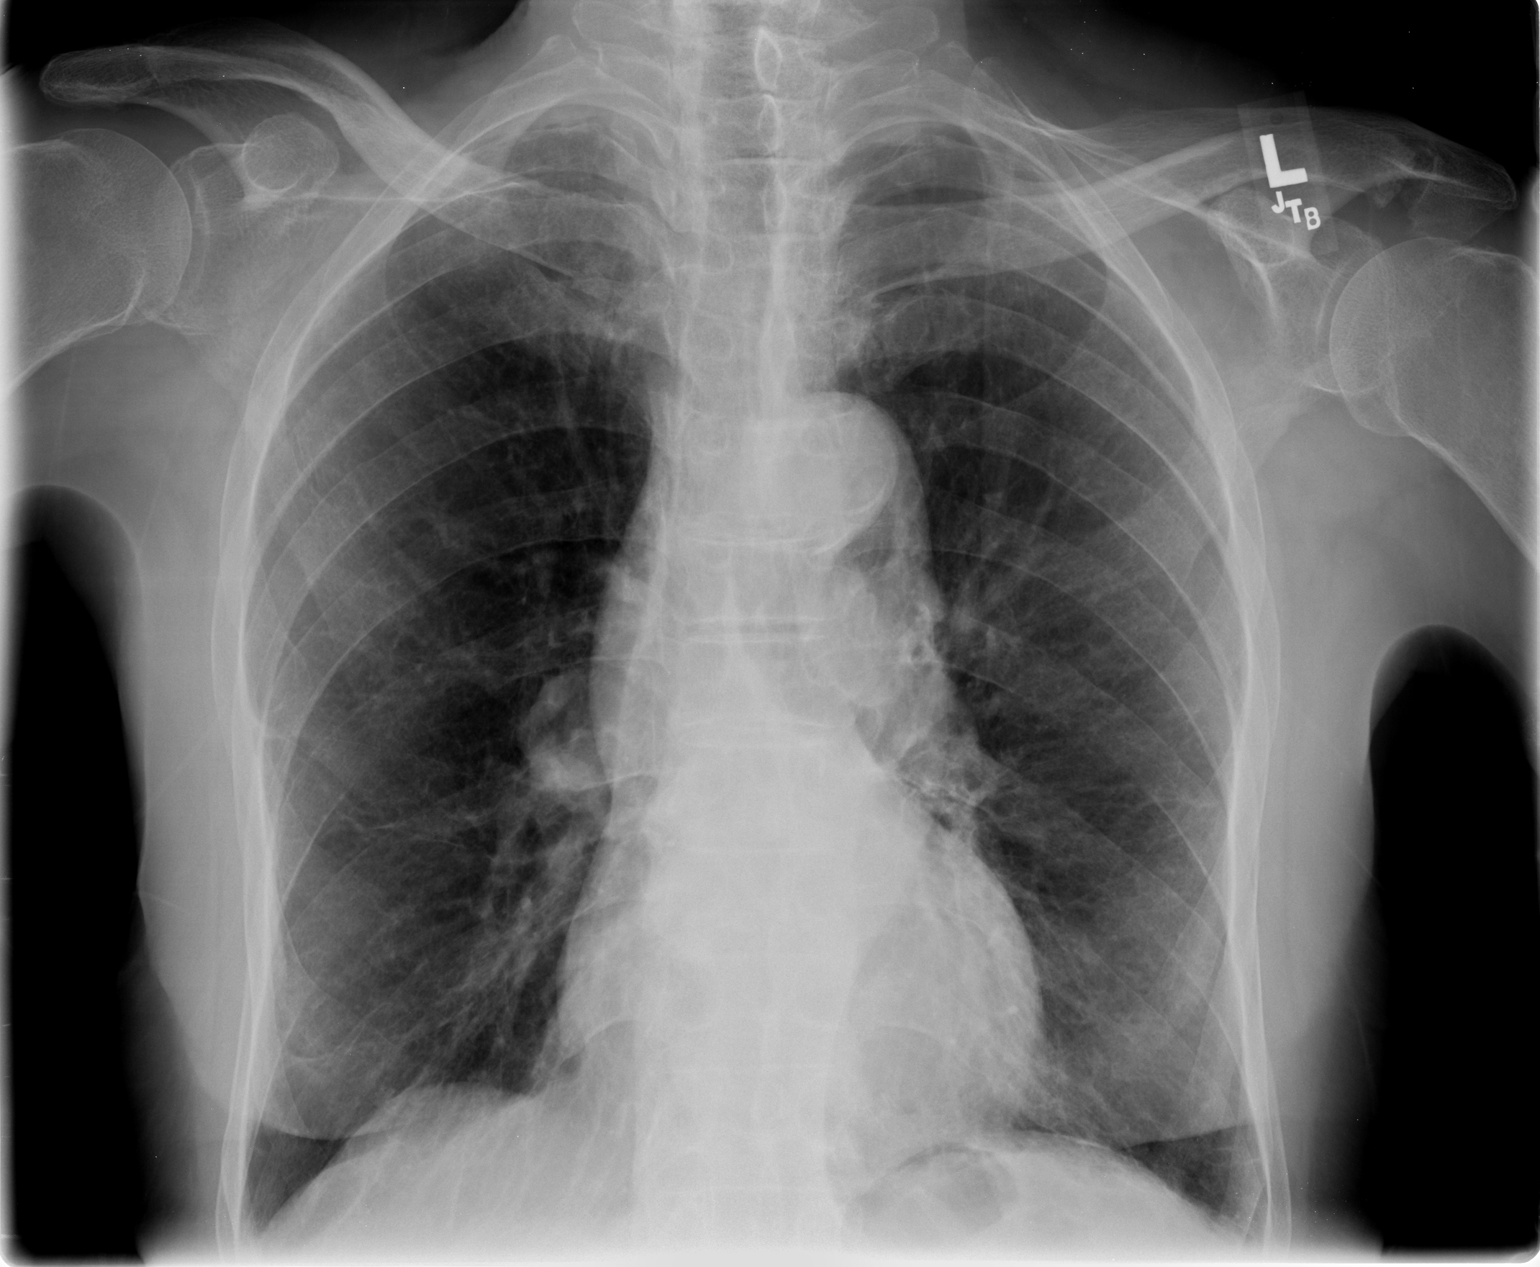

[view not recorded (2 of 2)]
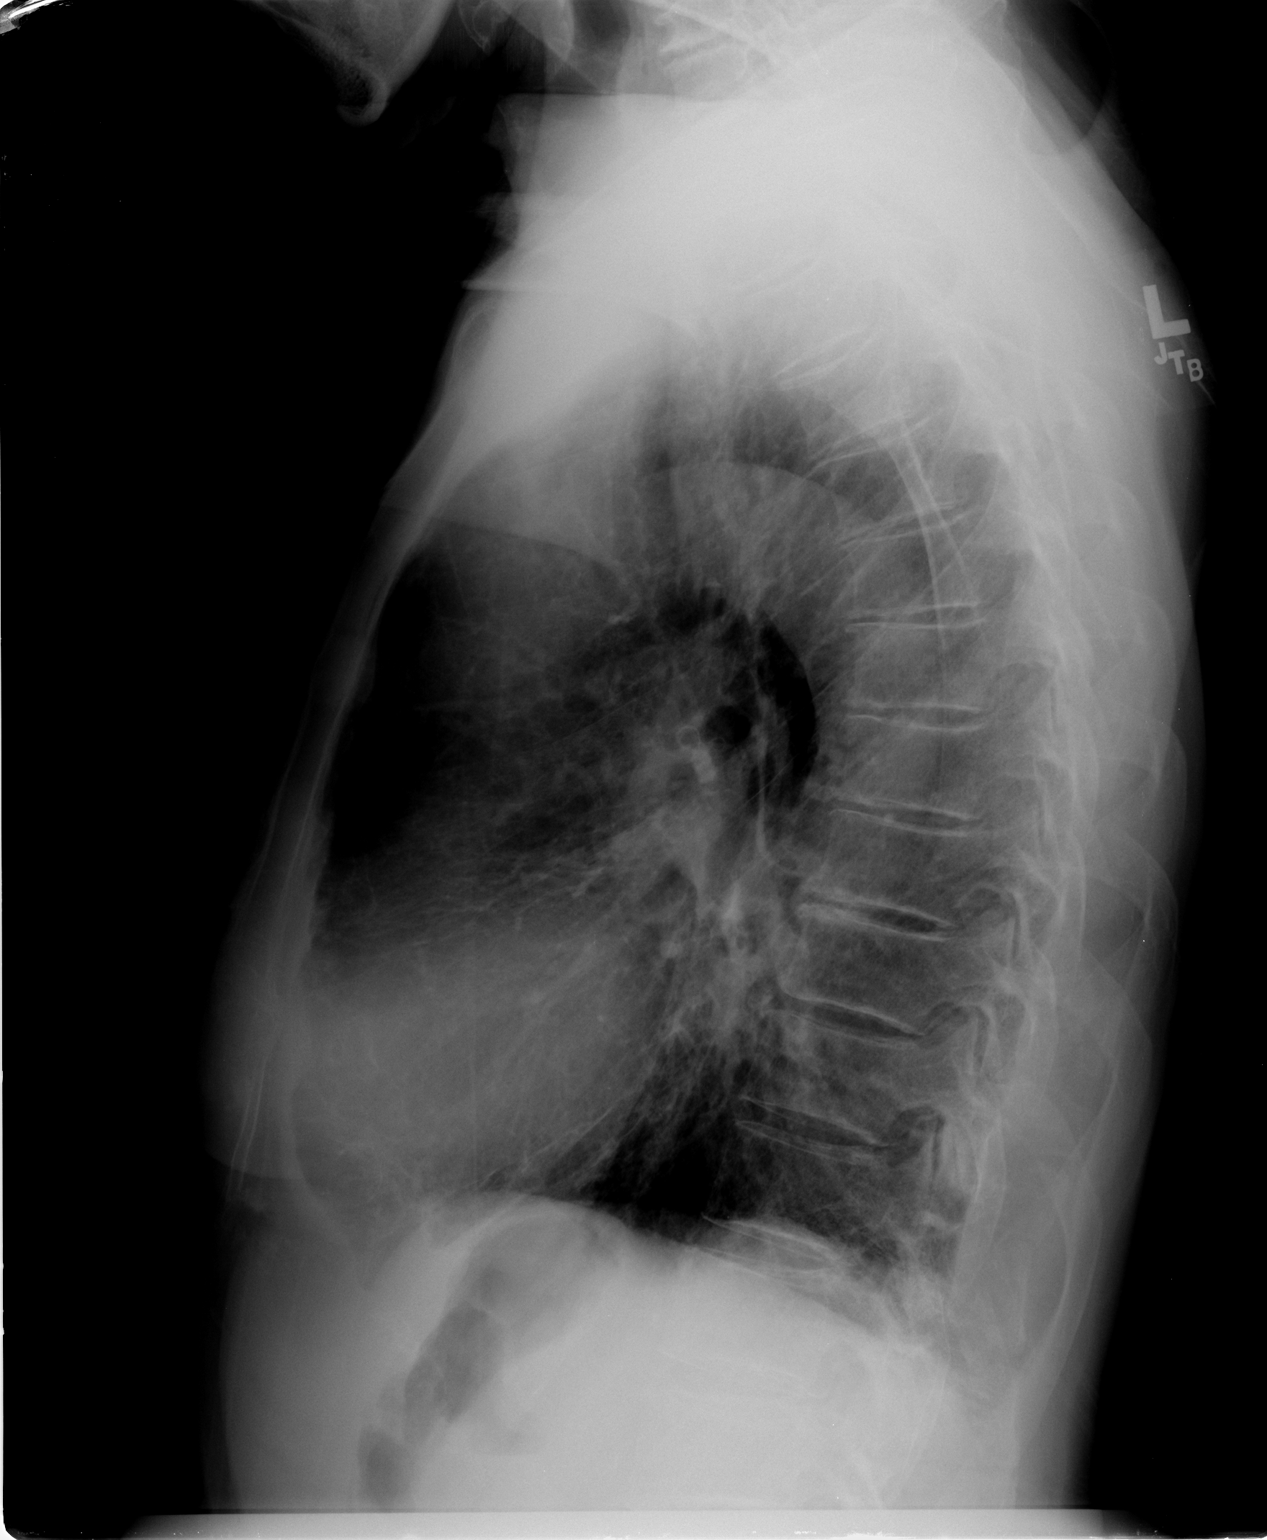

[2 of 2 positions shown; findings below may reference images not displayed]

FINDINGS: Normal heart size and pulmonary vascularity.
Calcified tortuous thoracic aorta.
Minimal chronic accentuation of interstitial markings.
No acute pulmonary infiltrate or pleural effusion.
Underlying COPD.
Bones appear demineralized.
IMPRESSION: COPD.
No acute abnormalities.

## 2011-05-05 ENCOUNTER — Ambulatory Visit (INDEPENDENT_AMBULATORY_CARE_PROVIDER_SITE_OTHER): Payer: Medicare Other | Admitting: Cardiology

## 2011-05-05 ENCOUNTER — Encounter: Payer: Self-pay | Admitting: Cardiology

## 2011-05-05 VITALS — BP 101/64 | HR 90 | Ht 71.0 in | Wt 166.0 lb

## 2011-05-05 DIAGNOSIS — I5033 Acute on chronic diastolic (congestive) heart failure: Secondary | ICD-10-CM

## 2011-05-05 DIAGNOSIS — I519 Heart disease, unspecified: Secondary | ICD-10-CM

## 2011-05-05 DIAGNOSIS — R0602 Shortness of breath: Secondary | ICD-10-CM

## 2011-05-05 DIAGNOSIS — D649 Anemia, unspecified: Secondary | ICD-10-CM

## 2011-05-05 DIAGNOSIS — I5189 Other ill-defined heart diseases: Secondary | ICD-10-CM

## 2011-05-05 NOTE — Patient Instructions (Addendum)
Your physician recommends that you go to the [facility] for lab work. If the results of your test are normal or stable, you will receive a letter.  If they are abnormal, the nurse will contact you by phone. Your physician wants you to follow up in:  3 months.  You will receive a reminder letter in the mail one-two months in advance.  If you don't receive a letter, please call our office to schedule the follow up appointment

## 2011-05-06 ENCOUNTER — Encounter: Payer: Self-pay | Admitting: Cardiology

## 2011-05-06 DIAGNOSIS — I5189 Other ill-defined heart diseases: Secondary | ICD-10-CM | POA: Insufficient documentation

## 2011-05-06 DIAGNOSIS — D649 Anemia, unspecified: Secondary | ICD-10-CM | POA: Insufficient documentation

## 2011-05-06 NOTE — Progress Notes (Signed)
Ian Bottoms, MD, [facility] Board Certified in Adult Cardiovascular Medicine,Internal Medicine and Critical Care Medicine    CC: followup patient with a history of recent hospitalization for dyspnea.  HPI:  The patient is an elderly African American male with a history of chronic diastolic heart failure but nonobstructive coronary artery disease as well as history of supraventricular tachycardia possibly atrial flutter.  The patient also has a history of significant anemia. From a cardiac perspective he reports no palpitations or chest pain.  He does report significant shortness of breath on minimal exertion.  He states "I dual huffing and puffing".  It including on his jacket makes him short of breath.  The patient reports that he does drink quite a bit of fluids.  He does not report however any edema.  He has difficulty ambulating and typically uses a walker.   PMH: reviewed and listed in Problem List in Electronic Records (and see below) Past Medical History  Diagnosis Date  . COPD (chronic obstructive pulmonary disease)   . Coronary artery disease     nonobstructive coronary artery disease by catheterization 2006, negative duplex scan for ischemia with ejection fraction 48% in 2011,  . Supraventricular tachycardia     questionable history of atrial flutter  . Chronic kidney disease     stage 3  . Gout   . Hiatal hernia   . BPH (benign prostatic hypertrophy)   . DJD (degenerative joint disease)     severe of both knees  . Anemia     history of hemoglobin of 8.5.,  More recent hemoglobin 10.4.  Marland Kitchen Diastolic dysfunction     echocardiogram ejection fraction 60-65% grade 3 diastolic dysfunction   Past Surgical History  Procedure Date  . Bilateral knee replacements   . Bilateral cataracts     Allergies/SH/FHX : available in Electronic Records for review  No Known Allergies History   Social History  . Marital Status: Married    Spouse Name: N/A    Number of Children: N/A    . Years of Education: N/A   Occupational History  . Not on file.   Social History Main Topics  . Smoking status: Former Smoker -- 2.0 packs/day for 25 years    Quit date: 05/03/1960  . Smokeless tobacco: Never Used  . Alcohol Use: No  . Drug Use: Not on file  . Sexually Active: Not on file   Other Topics Concern  . Not on file   Social History Narrative  . No narrative on file   Family History  Problem Relation Age of Onset  . Stomach cancer Father     Medications: Current Outpatient Prescriptions  Medication Sig Dispense Refill  . albuterol (PROVENTIL) (2.5 MG/3ML) 0.083% nebulizer solution Take 3 mLs (2.5 mg total) by nebulization every 6 (six) hours as needed.  75 mL  3  . allopurinol (ZYLOPRIM) 300 MG tablet Take 300 mg by mouth daily.        Marland Kitchen aspirin 81 MG tablet Take 81 mg by mouth daily.        Marland Kitchen diltiazem (CARDIZEM CD) 180 MG 24 hr capsule Take two by mouth daily        . docusate sodium (COLACE) 100 MG capsule Take 2 by mouth twice daily        . isosorbide mononitrate (IMDUR) 60 MG 24 hr tablet Take 1/2 by mouth daily       . megestrol (MEGACE) 40 MG/ML suspension Take 5ml by mouth  twice times daily      . metoprolol tartrate (LOPRESSOR) 25 MG tablet Take one by mouth twice daily       . polyethylene glycol (MIRALAX / GLYCOLAX) packet 17 gm mixed with 8 oz water or juice twice daily         ROS: No nausea or vomiting. No fever or chills.No melena or hematochezia.No bleeding.No claudication  Physical Exam: BP 101/64  Pulse 90  Ht 5\' 11"  (1.803 m)  Wt 166 lb (75.297 kg)  BMI 23.15 kg/m2 General:somewhat cachectic-appearing African American male Neck:normal heart upstrokes and no carotid bruit.  No thyromegaly non-nodular thyroid.  JVP is 7 cm Mouth: Very poor dentition Lungs:crackles bilaterally at the bases Cardiac:regular rate and rhythm with normal S1 and S2 and no definite pathological murmurs Vascular:no edema.  Normal dorsalis pedis and posterior  tibial pulses Skin:warm and dry Physcologic:normal affect  12lead ECG:not obtained Limited bedside ECHO:N/A   Patient Active Problem List  Diagnoses  . SVT (supraventricular tachycardia)  . CAD (coronary artery disease)  . Chronic kidney disease (CKD), stage III (moderate)  . COPD (chronic obstructive pulmonary disease)  . Anemia  . Diastolic dysfunction-chronic dyspnea    PLAN   The patient's main complaint is chronic dyspnea.  He does have some crackles on exam.  He states that he drinks quite a bit of fluids.  I told him to be careful and moderate his intake.  It is not entirely clear however it is truly in heart failure and I will obtain a BNP level.  Dyspnea could be multifactorial in particular the patient has a history of anemia and needs a followup CBC.  The patient has renal insufficiency and we will obtain a followup BMET to followup his renal function.  Possible the patient could have worsening renal insufficiency which could be contributing to his worsening diastolic heart failure.

## 2012-02-08 DIAGNOSIS — E860 Dehydration: Secondary | ICD-10-CM

## 2012-06-03 DEATH — deceased
